# Patient Record
Sex: Male | Born: 1980 | Hispanic: No | Marital: Married | Smoking: Never smoker
Health system: Southern US, Community
[De-identification: ages and names within clinical notes are randomized; demographics above are authoritative.]

---

## 2012-08-12 ENCOUNTER — Ambulatory Visit (INDEPENDENT_AMBULATORY_CARE_PROVIDER_SITE_OTHER): Payer: 59 | Admitting: Internal Medicine

## 2012-08-12 VITALS — BP 116/79 | HR 94 | Temp 98.0°F | Resp 16 | Ht 68.0 in | Wt 220.8 lb

## 2012-08-12 DIAGNOSIS — J029 Acute pharyngitis, unspecified: Secondary | ICD-10-CM

## 2012-08-12 DIAGNOSIS — J019 Acute sinusitis, unspecified: Secondary | ICD-10-CM

## 2012-08-12 LAB — POCT RAPID STREP A (OFFICE): Rapid Strep A Screen: NEGATIVE

## 2012-08-12 MED ORDER — HYDROCODONE-HOMATROPINE 5-1.5 MG/5ML PO SYRP: 5.0000 mL | ORAL_SOLUTION | Freq: Four times a day (QID) | ORAL | Status: DC | PRN

## 2012-08-12 MED ORDER — AMOXICILLIN 500 MG PO CAPS: 1000.0000 mg | ORAL_CAPSULE | Freq: Two times a day (BID) | ORAL | Status: DC

## 2012-08-12 NOTE — Addendum Note (Signed)
Addended by: Tonye Pearson on: 08/12/2012 10:38 AM   Modules accepted: Orders

## 2012-08-12 NOTE — Progress Notes (Signed)
  Subjective:    Patient ID: Jeremy Garza, male    DOB: 1981/01/06, 31 y.o.   MRN: [id number]  HPIOn Wednesday night he developed chills and fever and rapidly progressed to include nasal congestion with cough and sore throat Fever has continued His cough is occasionally productive and hurts in his upper chest/no shortness of breath or wheezing It hurts to swallow He has purulent nasal discharge with bloody discharge from the left nostril He has a headache with facial pressure His infant had hand foot mouth disease 2 weeks ago The whole family has had a cold/cough illness for the last 2 weeks     Review of Systems     Objective:   Physical Exam Vital signs stable TMs clear Nares purulent discharge with tenderness to percussion on the left maxillary area Throat is red without exudate No nodes Chest clear to auscultation   Results for orders placed in visit on 08/12/12  POCT RAPID STREP A (OFFICE)      Component Value Range   Rapid Strep A Screen Negative  Negative       Assessment & Plan:  Problem #1 fever Problem #2 acute sinusitis secondary to recent viral URI Problem #3 cough secondary  Plan-amoxicillin 1 g twice a day 10 days Hycodan Advil Fluids and bed rest Expect work by Monday or Tuesday

## 2012-08-18 ENCOUNTER — Other Ambulatory Visit: Payer: Self-pay | Admitting: Internal Medicine

## 2012-08-18 NOTE — Telephone Encounter (Signed)
At tl desk 

## 2012-08-20 ENCOUNTER — Other Ambulatory Visit: Payer: Self-pay | Admitting: Internal Medicine

## 2012-08-20 NOTE — Telephone Encounter (Signed)
Pharmacy told pt that he needs prior authorization for hydocodon cough syrup.  [facility] [address]  [phone]

## 2012-08-27 ENCOUNTER — Telehealth: Payer: Self-pay | Admitting: *Deleted

## 2012-08-27 NOTE — Telephone Encounter (Signed)
Jeremy Garza 08/20/2012 10:25 AM Signed  Pharmacy told pt that he needs prior authorization for hydocodon cough syrup.  [facility] [address]  [phone]

## 2012-08-27 NOTE — Telephone Encounter (Signed)
Advised pt that rx is at pharmacy and is ready

## 2012-09-02 ENCOUNTER — Other Ambulatory Visit: Payer: Self-pay | Admitting: Internal Medicine

## 2012-09-02 ENCOUNTER — Other Ambulatory Visit: Payer: Self-pay | Admitting: Physician Assistant

## 2012-09-06 ENCOUNTER — Ambulatory Visit (INDEPENDENT_AMBULATORY_CARE_PROVIDER_SITE_OTHER): Payer: 59 | Admitting: Family Medicine

## 2012-09-06 ENCOUNTER — Emergency Department (HOSPITAL_BASED_OUTPATIENT_CLINIC_OR_DEPARTMENT_OTHER)
Admission: EM | Admit: 2012-09-06 | Discharge: 2012-09-07 | Disposition: A | Discharge status: Home / Self Care | Payer: 59 | Attending: Emergency Medicine | Admitting: Emergency Medicine

## 2012-09-06 ENCOUNTER — Emergency Department (HOSPITAL_BASED_OUTPATIENT_CLINIC_OR_DEPARTMENT_OTHER): Payer: 59

## 2012-09-06 ENCOUNTER — Encounter (HOSPITAL_BASED_OUTPATIENT_CLINIC_OR_DEPARTMENT_OTHER): Payer: Self-pay | Admitting: *Deleted

## 2012-09-06 VITALS — BP 118/81 | HR 98 | Temp 98.9°F | Resp 18 | Ht 68.0 in | Wt 220.0 lb

## 2012-09-06 DIAGNOSIS — J029 Acute pharyngitis, unspecified: Secondary | ICD-10-CM

## 2012-09-06 DIAGNOSIS — R0982 Postnasal drip: Secondary | ICD-10-CM

## 2012-09-06 DIAGNOSIS — Z79899 Other long term (current) drug therapy: Secondary | ICD-10-CM | POA: Insufficient documentation

## 2012-09-06 DIAGNOSIS — Z7982 Long term (current) use of aspirin: Secondary | ICD-10-CM | POA: Insufficient documentation

## 2012-09-06 LAB — POCT RAPID STREP A (OFFICE): Rapid Strep A Screen: NEGATIVE

## 2012-09-06 MED ORDER — FLUTICASONE PROPIONATE 50 MCG/ACT NA SUSP: 2.0000 | Freq: Every day | NASAL | Status: DC

## 2012-09-06 MED ORDER — CETIRIZINE HCL 10 MG PO TABS: 10.0000 mg | ORAL_TABLET | Freq: Every day | ORAL | Status: DC

## 2012-09-06 NOTE — ED Notes (Signed)
C/o sore throat x 1 month with no relief from antibiotics. Pt was seen at [facility] today for same.

## 2012-09-06 NOTE — Patient Instructions (Signed)
Very nice to meet you. I think this is all part of your allergies. I'm giving you to new medications. One is called Zyrtec. Take one pill daily or at night. He also going to do Flonase which is a nose spray. 2 one spray in each nostril daily. If not better in 2 weeks please come back and be seen. If you get fevers chills shortness of breath or trouble breathing please seek medical attention immediately.   Allergic Rhinitis Allergic rhinitis is when the mucous membranes in the nose respond to allergens. Allergens are particles in the air that cause your body to have an allergic reaction. This causes you to release allergic antibodies. Through a chain of events, these eventually cause you to release histamine into the blood stream (hence the use of antihistamines). Although meant to be protective to the body, it is this release that causes your discomfort, such as frequent sneezing, congestion and an itchy runny nose.  CAUSES  The pollen allergens may come from grasses, trees, and weeds. This is seasonal allergic rhinitis, or "hay fever." Other allergens cause year-round allergic rhinitis (perennial allergic rhinitis) such as house dust mite allergen, pet dander and mold spores.  SYMPTOMS   Nasal stuffiness (congestion).  Runny, itchy nose with sneezing and tearing of the eyes.  There is often an itching of the mouth, eyes and ears. It cannot be cured, but it can be controlled with medications. DIAGNOSIS  If you are unable to determine the offending allergen, skin or blood testing may find it. TREATMENT   Avoid the allergen.  Medications and allergy shots (immunotherapy) can help.  Hay fever may often be treated with antihistamines in pill or nasal spray forms. Antihistamines block the effects of histamine. There are over-the-counter medicines that may help with nasal congestion and swelling around the eyes. Check with your caregiver before taking or giving this medicine. If the treatment  above does not work, there are many new medications your caregiver can prescribe. Stronger medications may be used if initial measures are ineffective. Desensitizing injections can be used if medications and avoidance fails. Desensitization is when a patient is given ongoing shots until the body becomes less sensitive to the allergen. Make sure you follow up with your caregiver if problems continue. SEEK MEDICAL CARE IF:   You develop fever (more than 100.5 F (38.1 C).  You develop a cough that does not stop easily (persistent).  You have shortness of breath.  You start wheezing.  Symptoms interfere with normal daily activities. Document Released: 06/08/2001 Document Revised: 12/06/2011 Document Reviewed: 12/18/2008 [facility] Patient Information 2013 [place], [place].

## 2012-09-06 NOTE — Progress Notes (Signed)
Patient is a 31 year old male who is here one month ago with complaint of acute sinusitis.  Patient states the sinusitis seemed to improve but now he has a sore throat the posterior pharynx. Patient states that this has been occurring for the approximately last 3-5 days. Patient denies any fever subjective chills. Patient states that he has no cough and only complains mostly of sore throat. Patient has had no change in diet, no recent weight loss and is not a smoker. Patient states he has a history of tonsillitis as a child. Patient has been taking over-the-counter Sudafed with no improvement.  Strep negative  Past medical history, social, surgical and family history all reviewed.   Review of systems: 14 system review is done and unremarkable as related to the orthopedic problem.  Physical Exam Blood pressure 118/81, pulse 98, temperature 98.9 F (37.2 C), temperature source Oral, resp. rate 18, height 5\' 8"  (1.727 m), weight 220 lb (99.791 kg), SpO2 99.00%. General: No apparent distress alert and oriented x3 mood and affect normal HEENT: Pupils equal round reactive to light and accommodation extraocular movements intact patient does have some erythema of the posterior pharynx with a postnasal drip. Patient does have mild enlargement of the tonsils but no exudate appreciated. Patient is tender to palpation with some mild swollen anterior lymph nodes. Respiratory: Clear to auscultation bilaterally Cardiovascular: Regular in rhythm no murmur Skin: No rash  Assessment Allergic rhinitis Plan Zyrtec daily Flonase daily Patient was given handout about other home modalities. Patient warned of potential red flags and when to seek medical attention Patient will followup in 2 weeks if not better.

## 2012-09-06 NOTE — ED Notes (Signed)
Reports sore throat x 1 month, went to urgent care today zithromax given,

## 2012-09-06 NOTE — ED Notes (Signed)
Pt reports that he has prescription for hycodan at [facility] that he has not picked up

## 2012-09-07 LAB — MONONUCLEOSIS SCREEN: Mono Screen: NEGATIVE

## 2012-09-07 LAB — RAPID STREP SCREEN (MED CTR MEBANE ONLY): Streptococcus, Group A Screen (Direct): NEGATIVE

## 2012-09-07 MED ORDER — LIDOCAINE VISCOUS 2 % MT SOLN
OROMUCOSAL | Status: AC
  Administered 2012-09-07: 20 mL via OROMUCOSAL
  Filled 2012-09-07: qty 15

## 2012-09-07 MED ORDER — TRAMADOL HCL 50 MG PO TABS: 50.0000 mg | ORAL_TABLET | Freq: Four times a day (QID) | ORAL | Status: DC | PRN

## 2012-09-07 MED ORDER — LIDOCAINE VISCOUS 2 % MT SOLN
20.0000 mL | Freq: Once | OROMUCOSAL | Status: AC
  Administered 2012-09-07: 20 mL via OROMUCOSAL

## 2012-09-07 NOTE — ED Provider Notes (Signed)
History     CSN: [id number]  Arrival date & time 09/06/12  2309   First MD Initiated Contact with Patient 09/06/12 2340      Chief Complaint  Patient presents with  . Sore Throat    (Consider location/radiation/quality/duration/timing/severity/associated sxs/prior treatment) Patient is a 31 y.o. male presenting with pharyngitis. The history is provided by the patient.  Sore Throat This is a chronic problem. The current episode started more than 1 week ago (more than a month). The problem occurs constantly. The problem has not changed since onset.Pertinent negatives include no chest pain, no abdominal pain, no headaches and no shortness of breath. Nothing aggravates the symptoms. Nothing relieves the symptoms. Treatments tried: narcotic antibiotics and decogestants. The treatment provided no relief.    History reviewed. No pertinent past medical history.  History reviewed. No pertinent past surgical history.  Family History  Problem Relation Age of Onset  . Diabetes Father     History  Substance Use Topics  . Smoking status: Never Smoker   . Smokeless tobacco: Not on file  . Alcohol Use: No      Review of Systems  Constitutional: Negative for fever.  HENT: Positive for sore throat and postnasal drip. Negative for facial swelling, drooling, trouble swallowing, neck pain, neck stiffness and voice change.   Respiratory: Negative for shortness of breath.   Cardiovascular: Negative for chest pain.  Gastrointestinal: Negative for abdominal pain.  Neurological: Negative for headaches.  All other systems reviewed and are negative.    Allergies  Review of patient's allergies indicates no known allergies.  Home Medications   Current Outpatient Rx  Name  Route  Sig  Dispense  Refill  . ASPIRIN EFFERVESCENT 325 MG PO TBEF   Oral   Take 325 mg by mouth every 6 (six) hours as needed.         Marland Kitchen CETIRIZINE HCL 10 MG PO TABS   Oral   Take 1 tablet (10 mg total) by mouth  daily.   30 tablet   11   . FLUTICASONE PROPIONATE 50 MCG/ACT NA SUSP   Nasal   Place 2 sprays into the nose daily.   16 g   6   . ZICAM ALLERGY RELIEF NA   Nasal   Place into the nose.         Marland Kitchen HYDROCODONE-HOMATROPINE 5-1.5 MG/5ML PO SYRP      TAKE 5 MLS EVERY 6 HOURS AS NEEDED FOR COUGH (NEED APPT BEFORE MORE REFILLS)   120 mL   0   . IBUPROFEN 200 MG PO TABS   Oral   Take 200 mg by mouth every 6 (six) hours as needed.         Marland Kitchen TRAMADOL HCL 50 MG PO TABS   Oral   Take 1 tablet (50 mg total) by mouth every 6 (six) hours as needed for pain.   15 tablet   0     BP 130/70  Pulse 88  Temp 98.3 F (36.8 C) (Oral)  Resp 18  Ht 5\' 8"  (1.727 m)  Wt 218 lb (98.884 kg)  BMI 33.15 kg/m2  SpO2 100%  Physical Exam  Constitutional: He is oriented to person, place, and time. He appears well-developed and well-nourished. No distress.  HENT:  Head: Normocephalic and atraumatic. No trismus in the jaw.  Right Ear: No mastoid tenderness. Tympanic membrane is not injected.  Left Ear: No mastoid tenderness. Tympanic membrane is not injected.  Mouth/Throat: Oropharynx is clear and  moist. No oral lesions. No uvula swelling. No oropharyngeal exudate, posterior oropharyngeal edema, posterior oropharyngeal erythema or tonsillar abscesses.  Neck: Normal range of motion. Neck supple. No tracheal deviation present.  Cardiovascular: Normal rate and regular rhythm.   Pulmonary/Chest: Effort normal and breath sounds normal. No stridor. He has no wheezes. He has no rales.  Abdominal: Soft. Bowel sounds are normal. There is no tenderness. There is no rebound and no guarding.  Musculoskeletal: Normal range of motion.  Lymphadenopathy:    He has no cervical adenopathy.  Neurological: He is alert and oriented to person, place, and time.  Skin: Skin is warm and dry.  Psychiatric: He has a normal mood and affect.    ED Course  Procedures (including critical care time)   Labs Reviewed   RAPID STREP SCREEN  MONONUCLEOSIS SCREEN   Dg Neck Soft Tissue  09/07/2012  *RADIOLOGY REPORT*  Clinical Data: Sore throat for 30 days.  Swollen tonsils.  Pain.  NECK SOFT TISSUES - 1+ VIEW  Comparison: None.  Findings: Single lateral soft tissue view of the neck is obtained. No radiopaque foreign bodies. No soft tissue gas collections suggested.  Epiglottis and aryepiglottic folds do not appear thickened.  No prevertebral soft tissue swelling.  Cervical airway appears patent in the lateral plane.  Degenerative changes in the cervical spine.  IMPRESSION: No soft tissue swelling or foreign body identified.   Original Report Authenticated By: Burman Nieves, M.D.      1. Post-nasal drip   2. Pharyngitis       MDM  Concur with plan for zyrtect for PND.  Patient on flonase.  Will provide pain medication and follow up with ENT.  Suspect some degree of reflux.  Patient verbalizes understanding and agrees to follow up       Jayliah Benett Smitty Cords, MD 09/07/12 0960

## 2013-07-15 ENCOUNTER — Ambulatory Visit: Payer: 59

## 2013-07-15 ENCOUNTER — Ambulatory Visit (INDEPENDENT_AMBULATORY_CARE_PROVIDER_SITE_OTHER): Payer: 59 | Admitting: Internal Medicine

## 2013-07-15 VITALS — BP 118/76 | HR 82 | Temp 98.4°F | Resp 16 | Ht 68.0 in | Wt 226.2 lb

## 2013-07-15 DIAGNOSIS — M549 Dorsalgia, unspecified: Secondary | ICD-10-CM

## 2013-07-15 DIAGNOSIS — M5126 Other intervertebral disc displacement, lumbar region: Secondary | ICD-10-CM

## 2013-07-15 MED ORDER — OXYCODONE-ACETAMINOPHEN 5-325 MG PO TABS: 1.0000 | ORAL_TABLET | Freq: Three times a day (TID) | ORAL | Status: DC | PRN

## 2013-07-15 NOTE — Progress Notes (Signed)
  Subjective:    Patient ID: Jeremy Garza, male    DOB: 03/09/1981, 32 y.o.   MRN: [id number]  HPI Pt here c/o of chronic back pain, he has been treated with percocet 10 mg. Originally from [place], he does not have a pcp he is in the waiting list to establish care in [place] does not remember doctors name or clinic. Friday he was moving furniture when it started hurting again he has tried tylenol and ibuprofen with no relief. Denies weakness or tingling of legs. Normal bowel movents.  No incontinence. Walks with pain   Review of Systems     Objective:   Physical Exam  Vitals reviewed. Constitutional: He is oriented to person, place, and time. He appears well-developed and well-nourished. He appears distressed.  HENT:  Head: Normocephalic.  Eyes: EOM are normal.  Cardiovascular: Normal rate.   Pulmonary/Chest: Effort normal.  Musculoskeletal:       Lumbar back: He exhibits decreased range of motion, tenderness, bony tenderness, pain and spasm. He exhibits no swelling, no edema, no deformity, no laceration and normal pulse.  Neurological: He is alert and oriented to person, place, and time. No cranial nerve deficit or sensory deficit. He exhibits abnormal muscle tone. Gait abnormal.  Reflex Scores:      Patellar reflexes are 1+ on the right side and 2+ on the left side.      Achilles reflexes are 1+ on the right side and 2+ on the left side. Strength 5/6 on right, 6/6 left  Skin: No rash noted.  Psychiatric: He has a normal mood and affect. His behavior is normal. Thought content normal.     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

## 2013-07-15 NOTE — Progress Notes (Signed)
  Subjective:    Patient ID: Jeremy Garza, male    DOB: July 26, 1981, 32 y.o.   MRN: [id number]  HPI    Review of Systems     Objective:   Physical Exam        Assessment & Plan:

## 2013-08-09 ENCOUNTER — Ambulatory Visit (INDEPENDENT_AMBULATORY_CARE_PROVIDER_SITE_OTHER): Payer: 59 | Admitting: Emergency Medicine

## 2013-08-09 VITALS — BP 102/64 | HR 83 | Temp 98.3°F | Resp 16 | Ht 68.75 in | Wt 228.6 lb

## 2013-08-09 DIAGNOSIS — S335XXA Sprain of ligaments of lumbar spine, initial encounter: Secondary | ICD-10-CM

## 2013-08-09 DIAGNOSIS — M5126 Other intervertebral disc displacement, lumbar region: Secondary | ICD-10-CM

## 2013-08-09 DIAGNOSIS — M549 Dorsalgia, unspecified: Secondary | ICD-10-CM

## 2013-08-09 MED ORDER — NAPROXEN SODIUM 550 MG PO TABS: 550.0000 mg | ORAL_TABLET | Freq: Two times a day (BID) | ORAL | Status: DC

## 2013-08-09 MED ORDER — OXYCODONE-ACETAMINOPHEN 5-325 MG PO TABS: 1.0000 | ORAL_TABLET | Freq: Three times a day (TID) | ORAL | Status: DC | PRN

## 2013-08-09 MED ORDER — CYCLOBENZAPRINE HCL 10 MG PO TABS: 10.0000 mg | ORAL_TABLET | Freq: Three times a day (TID) | ORAL | Status: DC | PRN

## 2013-08-09 NOTE — Patient Instructions (Signed)

## 2013-08-09 NOTE — Progress Notes (Signed)
[facility] [address] [place] [address] [phone]- 0000  Date:  08/09/2013   Name:  Jeremy Garza   DOB:  [birth year]   MRN:  [id number]  PCP:  No PCP Per Patient    Chief Complaint: Follow-up   History of Present Illness:  Jeremy Garza is a 32 y.o. very pleasant male patient who presents with the following:  Seen in office on 10/19 and treated for a history of HNP with an exacerbation of low back pain.  Now to office requesting a refill on his percocet.  He has no radicular symptoms and no radiation of pain.  No history of direct injury.  No history of MRI.  Moved to [facility] from [place] in 2012.  Is pending an appt with primary care doc for follow on care.  Says pain is in the left low back but on exam confused and says it is on the right low back.  Constant and burning.  No improvement with any thing but percocet.  No improvement with over the counter medications or other home remedies. Denies other complaint or health concern today.   There are no active problems to display for this patient.   History reviewed. No pertinent past medical history.  History reviewed. No pertinent past surgical history.  History  Substance Use Topics  . Smoking status: Never Smoker   . Smokeless tobacco: Not on file  . Alcohol Use: No    Family History  Problem Relation Age of Onset  . Diabetes Father     No Known Allergies  Medication list has been reviewed and updated.  Current Outpatient Prescriptions on File Prior to Visit  Medication Sig Dispense Refill  . oxyCODONE-acetaminophen (ROXICET) 5-325 MG per tablet Take 1 tablet by mouth every 8 (eight) hours as needed for pain.  20 tablet  0  . aspirin-sod bicarb-citric acid (ALKA-SELTZER) 325 MG TBEF Take 325 mg by mouth every 6 (six) hours as needed.      . cetirizine (ZYRTEC) 10 MG tablet Take 1 tablet (10 mg total) by mouth daily.  30 tablet  11  . fluticasone (FLONASE) 50 MCG/ACT nasal spray Place 2 sprays into  the nose daily.  16 g  6  . Homeopathic Products (ZICAM ALLERGY RELIEF NA) Place into the nose.      Marland Kitchen HYDROcodone-homatropine (HYCODAN) 5-1.5 MG/5ML syrup TAKE 5 MLS EVERY 6 HOURS AS NEEDED FOR COUGH (NEED APPT BEFORE MORE REFILLS)  120 mL  0  . ibuprofen (ADVIL,MOTRIN) 200 MG tablet Take 200 mg by mouth every 6 (six) hours as needed.      . traMADol (ULTRAM) 50 MG tablet Take 1 tablet (50 mg total) by mouth every 6 (six) hours as needed for pain.  15 tablet  0   No current facility-administered medications on file prior to visit.    Review of Systems:  As per HPI, otherwise negative.    Physical Examination: Filed Vitals:   08/09/13 1845  BP: 102/64  Pulse: 83  Temp: 98.3 F (36.8 C)  Resp: 16   Filed Vitals:   08/09/13 1845  Height: 5' 8.75" (1.746 m)  Weight: 228 lb 9.6 oz (103.692 kg)   Body mass index is 34.01 kg/(m^2). Ideal Body Weight: Weight in (lb) to have BMI = 25: 167.7   GEN: WDWN, NAD, Non-toxic, Alert & Oriented x 3 HEENT: Atraumatic, Normocephalic.  Ears and Nose: No external deformity. EXTR: No clubbing/cyanosis/edema NEURO: Normal gait. Gross motor and DTR  intact PSYCH: Normally interactive. Conversant. Not depressed or anxious appearing.  Calm demeanor.  BACK:  Tender in right lumbar para spinous muscle.  Assessment and Plan: Acute lumbar strain Offered PT but refused Companion annoyed that we would not just give a prescription "for what works" rather than undergo an examination. Flexeril Anaprox vicodin  Signed,  Phillips Odor, MD

## 2013-08-22 ENCOUNTER — Other Ambulatory Visit: Payer: Self-pay | Admitting: Emergency Medicine

## 2013-11-06 ENCOUNTER — Ambulatory Visit (INDEPENDENT_AMBULATORY_CARE_PROVIDER_SITE_OTHER): Payer: 59 | Admitting: Family Medicine

## 2013-11-06 VITALS — BP 125/80 | HR 80 | Temp 98.4°F | Resp 18 | Wt 223.0 lb

## 2013-11-06 DIAGNOSIS — R05 Cough: Secondary | ICD-10-CM

## 2013-11-06 DIAGNOSIS — R059 Cough, unspecified: Secondary | ICD-10-CM

## 2013-11-06 DIAGNOSIS — R509 Fever, unspecified: Secondary | ICD-10-CM

## 2013-11-06 LAB — POCT CBC
GRANULOCYTE PERCENT: 62.1 % (ref 37–80)
HEMATOCRIT: 48.2 % (ref 43.5–53.7)
Hemoglobin: 15.6 g/dL (ref 14.1–18.1)
LYMPH, POC: 2 (ref 0.6–3.4)
MCH: 27.7 pg (ref 27–31.2)
MCHC: 32.4 g/dL (ref 31.8–35.4)
MCV: 85.6 fL (ref 80–97)
MID (cbc): 1 — AB (ref 0–0.9)
MPV: 8.5 fL (ref 0–99.8)
POC Granulocyte: 4.9 (ref 2–6.9)
POC LYMPH %: 25.8 % (ref 10–50)
POC MID %: 12.1 %M — AB (ref 0–12)
Platelet Count, POC: 322 10*3/uL (ref 142–424)
RBC: 5.63 M/uL (ref 4.69–6.13)
RDW, POC: 13.3 %
WBC: 7.9 10*3/uL (ref 4.6–10.2)

## 2013-11-06 LAB — POCT INFLUENZA A/B
INFLUENZA B, POC: NEGATIVE
Influenza A, POC: NEGATIVE

## 2013-11-06 MED ORDER — DOXYCYCLINE HYCLATE 100 MG PO TABS: 100.0000 mg | ORAL_TABLET | Freq: Two times a day (BID) | ORAL | Status: DC

## 2013-11-06 MED ORDER — HYDROCODONE-HOMATROPINE 5-1.5 MG/5ML PO SYRP: 5.0000 mL | ORAL_SOLUTION | Freq: Three times a day (TID) | ORAL | Status: AC | PRN

## 2013-11-06 NOTE — Patient Instructions (Signed)
You may have the flu although your test is negative today. I do suspect this is a viral infection- the doxycycline rx may not help you because it is for bacteria (antibiotic).  If you would like, leave this rx at the drug store for the next few days and see if you do not get better on your own . If your symptoms continue you can certainly fill and take this medication  Let me know if you are not feeling better in the next few days- Sooner if worse.

## 2013-11-06 NOTE — Progress Notes (Signed)
[facility] [address] [place] [address] [phone] 0000  Date:  11/06/2013   Name:  Jeremy Garza   DOB:  1980-12-04   MRN:  [id number]  PCP:  No PCP Per Patient    Chief Complaint: Cough, Fever and Generalized Body Aches   History of Present Illness:  Jeremy Garza is a 33 y.o. very pleasant male patient who presents with the following:  He is here today with illness- started a couple of days ago.  He notes a painful cough productive of mucus.   He also notes a HA, ear discomfort.  He thinks he might have had a temperature- subjective only. Also ST.  He did take advil this am- 800 mg He has aches and feels very tired and "very, very bad."  No Gi symptoms He is generally healthy.  His daughter has been ill recetnly with a cough- otherwise she is not as ill.    [facility]ussin- however this did not seem to help.  "I was up all night coughing."   There are no active problems to display for this patient.   No past medical history on file.  No past surgical history on file.  History  Substance Use Topics  . Smoking status: Never Smoker   . Smokeless tobacco: Not on file  . Alcohol Use: No    Family History  Problem Relation Age of Onset  . Diabetes Father     No Known Allergies  Medication list has been reviewed and updated.  Current Outpatient Prescriptions on File Prior to Visit  Medication Sig Dispense Refill  . HYDROcodone-homatropine (HYCODAN) 5-1.5 MG/5ML syrup TAKE 5 MLS EVERY 6 HOURS AS NEEDED FOR COUGH (NEED APPT BEFORE MORE REFILLS)  120 mL  0  . oxyCODONE-acetaminophen (ROXICET) 5-325 MG per tablet Take 1 tablet by mouth every 8 (eight) hours as needed.  20 tablet  0  . traMADol (ULTRAM) 50 MG tablet Take 1 tablet (50 mg total) by mouth every 6 (six) hours as needed for pain.  15 tablet  0   No current facility-administered medications on file prior to visit.    Review of  Systems:  As per HPI- otherwise negative.   Physical Examination: Filed Vitals:   11/06/13 0841  BP: 125/80  Pulse: 80  Temp: 98.4 F (36.9 C)  Resp: 18   Filed Vitals:   11/06/13 0841  Weight: 223 lb (101.152 kg)   Body mass index is 33.18 kg/(m^2). Ideal Body Weight:    GEN: WDWN, NAD, Non-toxic, A & O x 3, overweight HEENT: Atraumatic, Normocephalic. Neck supple. No masses, No LAD.  Bilateral TM wnl, oropharynx normal.  PEERL,EOMI.  Nasal cavity is congested Ears and Nose: No external deformity. CV: RRR, No M/G/R. No JVD. No thrill. No extra heart sounds. PULM: CTA B, no wheezes, crackles, rhonchi. No retractions. No resp. distress. No accessory muscle use. EXTR: No c/c/e NEURO Normal gait.  PSYCH: Normally interactive. Conversant. Not depressed or anxious appearing.  Calm demeanor.   Results for orders placed in visit on 11/06/13  POCT CBC      Result Value Range   WBC 7.9  4.6 - 10.2 K/uL   Lymph, poc 2.0  0.6 - 3.4   POC LYMPH PERCENT 25.8  10 - 50 %L   MID (cbc) 1.0 (*) 0 - 0.9   POC MID % 12.1 (*) 0 - 12 %M   POC Granulocyte  4.9  2 - 6.9   Granulocyte percent 62.1  37 - 80 %G   RBC 5.63  4.69 - 6.13 M/uL   Hemoglobin 15.6  14.1 - 18.1 g/dL   HCT, POC 16.148.2  09.643.5 - 53.7 %   MCV 85.6  80 - 97 fL   MCH, POC 27.7  27 - 31.2 pg   MCHC 32.4  31.8 - 35.4 g/dL   RDW, POC 04.513.3     Platelet Count, POC 322  142 - 424 K/uL   MPV 8.5  0 - 99.8 fL  POCT INFLUENZA A/B      Result Value Range   Influenza A, POC Negative     Influenza B, POC Negative       Assessment and Plan: Cough - Plan: POCT CBC, doxycycline (VIBRA-TABS) 100 MG tablet, HYDROcodone-homatropine (HYCODAN) 5-1.5 MG/5ML syrup  Fever, unspecified - Plan: POCT Influenza A/B  Probably viral URI- flu is possible.   See patient instructions for more details.   Try the hycodan instead of cheritussin- do not combine together  Signed Abbe AmsterdamJessica Copland, MD

## 2013-11-22 ENCOUNTER — Other Ambulatory Visit: Payer: Self-pay | Admitting: Emergency Medicine

## 2013-11-23 NOTE — Telephone Encounter (Signed)
Dr Dareen PianoAnderson, do you want to give pt RFs of these?

## 2013-11-29 ENCOUNTER — Other Ambulatory Visit: Payer: Self-pay | Admitting: Family Medicine

## 2013-11-29 ENCOUNTER — Ambulatory Visit (HOSPITAL_COMMUNITY)
Admission: RE | Admit: 2013-11-29 | Discharge: 2013-11-29 | Disposition: A | Discharge status: Home / Self Care | Payer: 59 | Source: Ambulatory Visit | Attending: Family Medicine | Admitting: Family Medicine

## 2013-11-29 ENCOUNTER — Ambulatory Visit (INDEPENDENT_AMBULATORY_CARE_PROVIDER_SITE_OTHER): Payer: 59 | Admitting: Family Medicine

## 2013-11-29 VITALS — BP 140/92 | HR 87 | Temp 98.0°F | Resp 18 | Wt 228.0 lb

## 2013-11-29 DIAGNOSIS — R51 Headache: Secondary | ICD-10-CM | POA: Insufficient documentation

## 2013-11-29 DIAGNOSIS — R519 Headache, unspecified: Secondary | ICD-10-CM

## 2013-11-29 DIAGNOSIS — J0100 Acute maxillary sinusitis, unspecified: Secondary | ICD-10-CM

## 2013-11-29 DIAGNOSIS — M5126 Other intervertebral disc displacement, lumbar region: Secondary | ICD-10-CM

## 2013-11-29 DIAGNOSIS — M549 Dorsalgia, unspecified: Secondary | ICD-10-CM

## 2013-11-29 LAB — POCT CBC
Granulocyte percent: 69.3 %G (ref 37–80)
HCT, POC: 40.2 % — AB (ref 43.5–53.7)
Hemoglobin: 12.9 g/dL — AB (ref 14.1–18.1)
Lymph, poc: 2.5 (ref 0.6–3.4)
MCH, POC: 26.9 pg — AB (ref 27–31.2)
MCHC: 32.1 g/dL (ref 31.8–35.4)
MCV: 83.7 fL (ref 80–97)
MID (cbc): 0.8 (ref 0–0.9)
MPV: 8.4 fL (ref 0–99.8)
POC Granulocyte: 7.5 — AB (ref 2–6.9)
POC LYMPH PERCENT: 23.6 %L (ref 10–50)
POC MID %: 7.1 %M (ref 0–12)
Platelet Count, POC: 379 10*3/uL (ref 142–424)
RBC: 4.8 M/uL (ref 4.69–6.13)
RDW, POC: 13.9 %
WBC: 10.8 10*3/uL — AB (ref 4.6–10.2)

## 2013-11-29 LAB — POCT SEDIMENTATION RATE: POCT SED RATE: 24 mm/hr — AB (ref 0–22)

## 2013-11-29 MED ORDER — OXYCODONE-ACETAMINOPHEN 5-325 MG PO TABS
1.0000 | ORAL_TABLET | Freq: Three times a day (TID) | ORAL | Status: AC | PRN
Start: 2013-11-29 — End: ?

## 2013-11-29 MED ORDER — AMOXICILLIN-POT CLAVULANATE 875-125 MG PO TABS: 1.0000 | ORAL_TABLET | Freq: Two times a day (BID) | ORAL | Status: AC

## 2013-11-29 MED ORDER — IOHEXOL 300 MG/ML  SOLN
100.0000 mL | Freq: Once | INTRAMUSCULAR | Status: AC | PRN
  Administered 2013-11-29: 100 mL via INTRAVENOUS

## 2013-11-29 NOTE — Progress Notes (Addendum)
This chart was scribed for Jeremy SidleKurt Lauenstein, MD by Nicholos Johnsenise Iheanachor, Medical Scribe. This patient's care was started at 6:40 PM  Patient ID: Jeremy Garza MRN: [id number], DOB: 02/14/1981, 33 y.o. Date of Encounter: 11/29/2013, 6:40 PM Primary Physician: No PCP Per Patient  Chief Complaint: jaw pain  HPI: 33 y.o. year old male with history below presents with stabbing right sided facial pain w/ associated swelling, onset 1 day ago. States he just woke up and the pain was present. Reports he cannot open or close his jaw without pain. Hurts to touch his face primarily along zyomatic arch and termporal region. States he took advil yesterday with no relief. Also took 2 neproxen this morning with no relief. Says he was only able to eat mashed potatoes, soup, and a milkshake today due to pain. Also states he was unable to sleep last night. Had to sleep with mouth open and reports drooling because of that. . Denies visual disturbances  No past medical history on file.   Home Meds: Prior to Admission medications   Medication Sig Start Date End Date Taking? Authorizing Provider  cyclobenzaprine (FLEXERIL) 10 MG tablet TAKE 1 TABLET BY MOUTH 3 TIMES A DAY AS NEEDED FOR MUSCLE SPASM    Phillips OdorJeffery Anderson, MD  doxycycline (VIBRA-TABS) 100 MG tablet Take 1 tablet (100 mg total) by mouth 2 (two) times daily. 11/06/13   Gwenlyn FoundJessica C Copland, MD  HYDROcodone-homatropine (HYCODAN) 5-1.5 MG/5ML syrup Take 5 mLs by mouth every 8 (eight) hours as needed for cough. 11/06/13   Gwenlyn FoundJessica C Copland, MD  naproxen sodium (ANAPROX) 550 MG tablet TAKE 1 TABLET (550 MG TOTAL) BY MOUTH 2 (TWO) TIMES DAILY WITH A MEAL.    Phillips OdorJeffery Anderson, MD    Allergies: No Known Allergies  History   Social History   Marital Status: Married    Spouse Name: N/A    Number of Children: N/A   Years of Education: N/A   Occupational History   Not on file.   Social History Main Topics   Smoking status: Never Smoker    Smokeless tobacco:  Not on file   Alcohol Use: No   Drug Use: No   Sexual Activity: Yes   Other Topics Concern   Not on file   Social History Narrative   No narrative on file     Review of Systems: Constitutional: negative for chills, fever, night sweats, weight changes, or fatigue  HEENT: negative for vision changes, hearing loss, congestion, rhinorrhea, ST, epistaxis, or sinus pressure. Right sided face and jaw pain Cardiovascular: negative for chest pain or palpitations Respiratory: negative for hemoptysis, wheezing, shortness of breath, or cough Abdominal: negative for abdominal pain, nausea, vomiting, diarrhea, or constipation Dermatological: negative for rash Neurologic: negative for headache, dizziness, or syncope All other systems reviewed and are otherwise negative with the exception to those above and in the HPI.   Physical Exam: Blood pressure 140/92, pulse 87, temperature 98 F (36.7 C), temperature source Oral, resp. rate 18, weight 228 lb (103.42 kg), SpO2 98.00%., Body mass index is 33.92 kg/(m^2). General: Well developed, well nourished, in no acute distress. Head: Normocephalic, atraumatic, eyes without discharge, sclera non-icteric, nares are without discharge. Bilateral auditory canals clear, TM's are without perforation, pearly grey and translucent with reflective cone of light bilaterally. Oral cavity moist, posterior pharynx without exudate, erythema, peritonsillar abscess, or post nasal drip.  Neck: Supple. No thyromegaly. Full ROM. No lymphadenopathy. Lungs: Clear bilaterally to auscultation without wheezes, rales, or rhonchi. Breathing is  unlabored. Heart: RRR with S1 S2. No murmurs, rubs, or gallops appreciated. Abdomen: Soft, non-tender, non-distended with normoactive bowel sounds. No hepatomegaly. No rebound/guarding. No obvious abdominal masses. Msk:  Strength and tone normal for age. Extremities/Skin: Warm and dry. No clubbing or cyanosis. No edema. No rashes or  suspicious lesions. Neuro: Alert and oriented X 3. Moves all extremities spontaneously. Gait is normal. CNII-XII grossly in tact. Psych:  Responds to questions appropriately with a normal affect.   Labs: blood work Results for orders placed in visit on 11/29/13  POCT CBC      Result Value Ref Range   WBC 10.8 (*) 4.6 - 10.2 K/uL   Lymph, poc 2.5  0.6 - 3.4   POC LYMPH PERCENT 23.6  10 - 50 %L   MID (cbc) 0.8  0 - 0.9   POC MID % 7.1  0 - 12 %M   POC Granulocyte 7.5 (*) 2 - 6.9   Granulocyte percent 69.3  37 - 80 %G   RBC 4.80  4.69 - 6.13 M/uL   Hemoglobin 12.9 (*) 14.1 - 18.1 g/dL   HCT, POC 40.9 (*) 81.1 - 53.7 %   MCV 83.7  80 - 97 fL   MCH, POC 26.9 (*) 27 - 31.2 pg   MCHC 32.1  31.8 - 35.4 g/dL   RDW, POC 91.4     Platelet Count, POC 379  142 - 424 K/uL   MPV 8.4  0 - 99.8 fL      ASSESSMENT AND PLAN:  33 y.o. year old male with acute swelling and pain in the TMJ region which could represent either an acute TMJ syndrome or acute parotitis or acute trigeminal neuralgia.  CT ordered for stat reading tonight to R/O abscess.--> no abscess, acute sinusitis seen in right maxilla, will order augmentin 875 bid x 10 days along with percocet 5-325  Signed, Jeremy Sidle, MD 11/29/2013 6:42 PM

## 2013-11-29 NOTE — Addendum Note (Signed)
Addended by: Elvina SidleLAUENSTEIN, Kamoni Gentles on: 11/29/2013 08:44 PM   Modules accepted: Orders

## 2013-11-29 NOTE — Patient Instructions (Signed)
There are several possible explanations for this acute pain. One explanation is that you have TMJ syndrome. This be consistent with ear pain when trying to open and close her jaw. Another possibility is that you have an infection in the parotid gland which overlies the TMJ. Finally a third possibility is an irritated nerve called the trigeminal nerve.  For the test results we have at the present time I can't tell which one of these is. They're from one to give you pain medicine and what have you see a specialist tomorrow for further evaluation

## 2013-11-30 ENCOUNTER — Telehealth: Payer: Self-pay

## 2013-11-30 NOTE — Telephone Encounter (Signed)
States that his ENT doctor prescribed him Doxycyline. He states that he is already taking Doxycycline that Dr. Patsy Lageropland gave him. He wants to know if he discontinue what Dr.Copland gave him or start taking the Doxycycline from his ENT or stop the Doxycyline all together?   [phone]

## 2013-11-30 NOTE — Telephone Encounter (Signed)
Spoke to pt , he is aware he should only be taking the Augmentin. He has an appointment with ENT today at 1pm.

## 2013-11-30 NOTE — Telephone Encounter (Signed)
Patient says he stopped taking the doxycyline that dr. Patsy Lageropland prescribed and saw Dr. Elbert EwingsL for Augmentin.  He wants to take doxycyline and the augmentin at the same time.  For it to be effective.  Is it safe?  Please call.   [phone]

## 2013-12-03 NOTE — Telephone Encounter (Signed)
Lm for rtn call 

## 2013-12-04 ENCOUNTER — Encounter: Payer: Self-pay | Admitting: Family Medicine

## 2013-12-04 LAB — MUMPS ANTIBODY, IGM: Mumps IgM Value: <1:20 {titer}

## 2013-12-04 NOTE — Telephone Encounter (Signed)
Advised pt to continue taking medication as prescribed by the ENT we referred him to. Pt understands.

## 2013-12-07 LAB — MUMPS ANTIBODY, IGG: Mumps IgG: 88.4 AU/mL — ABNORMAL HIGH (ref <9.00)

## 2015-06-28 IMAGING — CT CT MAXILLOFACIAL W/ CM
1 series · 15 of 30 positions shown, 19 images · IV contrast (omnipaque)
Comparison: DG NECK SOFT TISSUE dated 09/07/2012

CLINICAL DATA: Right-sided facial pain for 2 days, assess for
abscess.

EXAM:
CT MAXILLOFACIAL WITH CONTRAST
TECHNIQUE: Multidetector CT imaging of the maxillofacial structures was
performed with intravenous contrast. Multiplanar CT image
reconstructions were also generated. A small metallic BB was placed
on the right temple in order to reliably differentiate right from
left.
CONTRAST:  100mL OMNIPAQUE IOHEXOL 300 MG/ML  SOLN

[Series 3: facial st · axial · 0.38mm/px · z∈[-304,-144]mm · 15 of 86 slices shown, 19 images]
[im 3/86  brain]
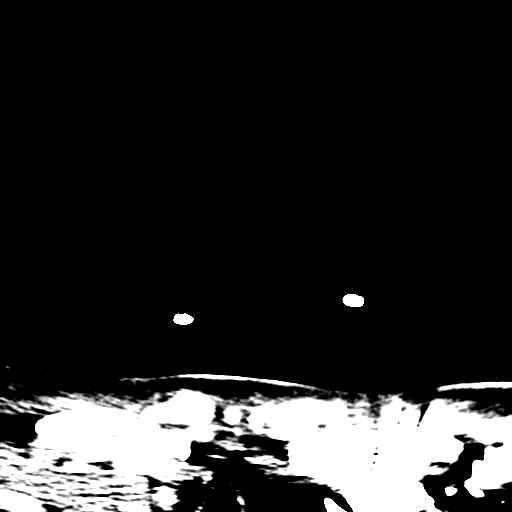
[im 3/86  bone]
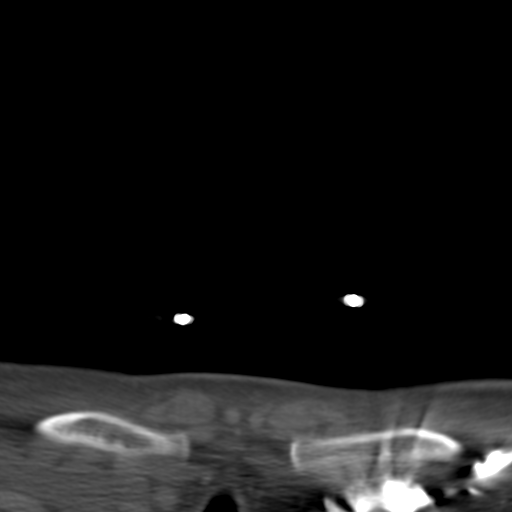
[im 9/86  bone]
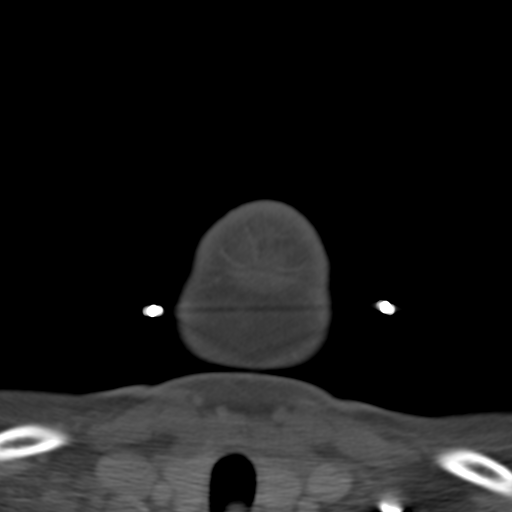
[im 15/86  bone]
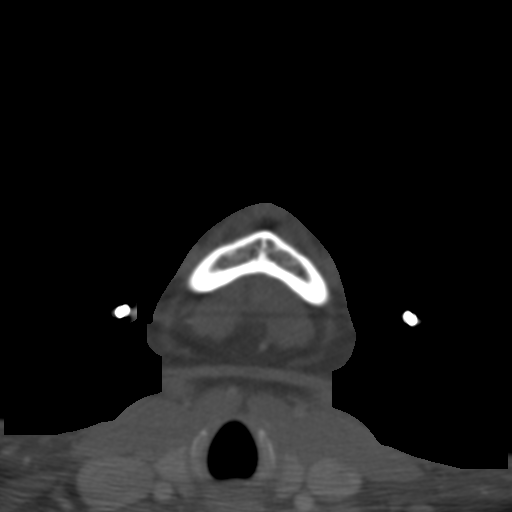
[im 21/86  bone]
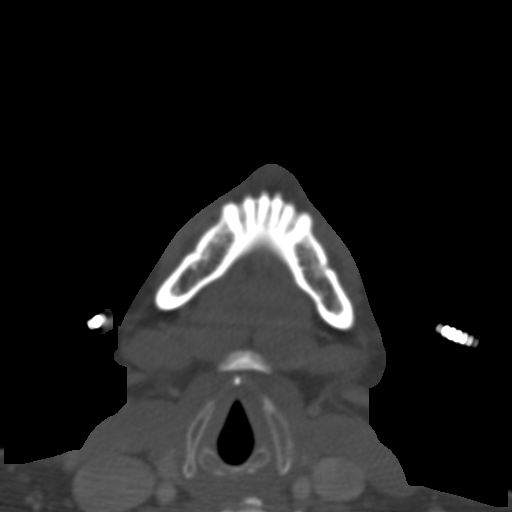
[im 27/86  brain]
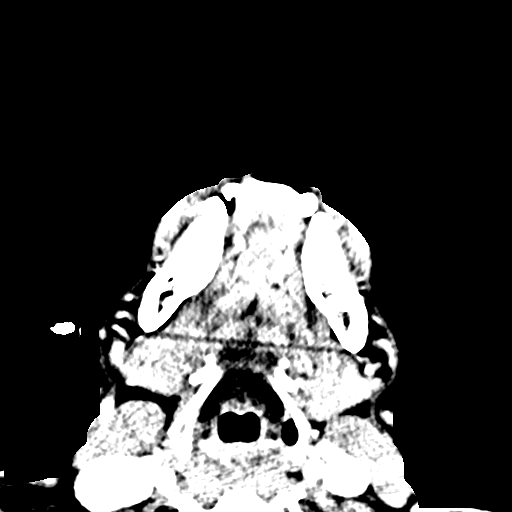
[im 27/86  bone]
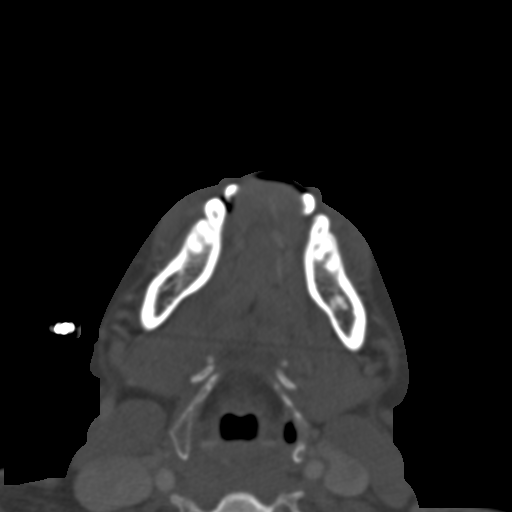
[im 33/86  bone]
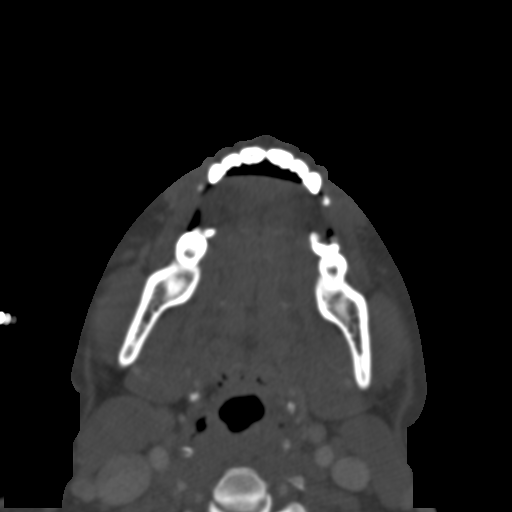
[im 39/86  bone]
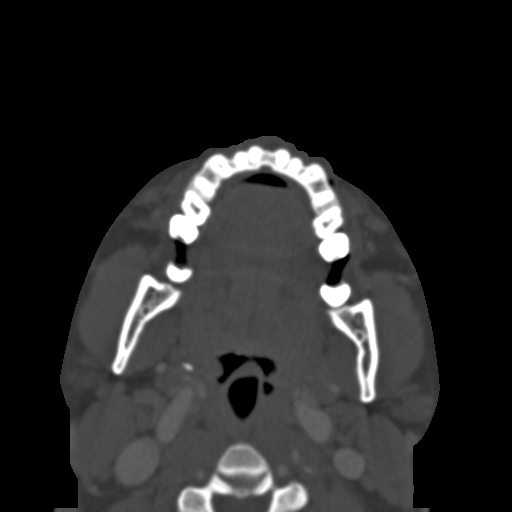
[im 44/86  bone]
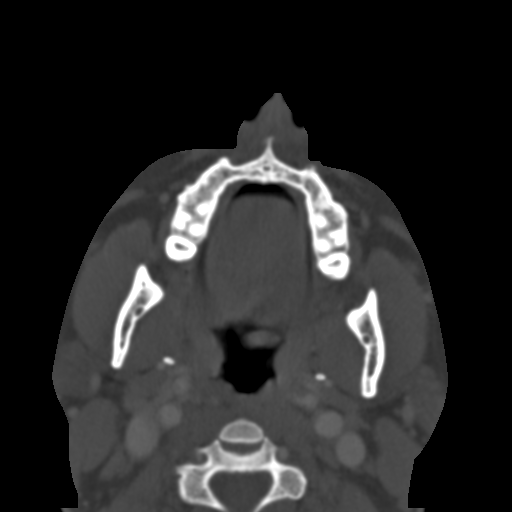
[im 47/86  brain]
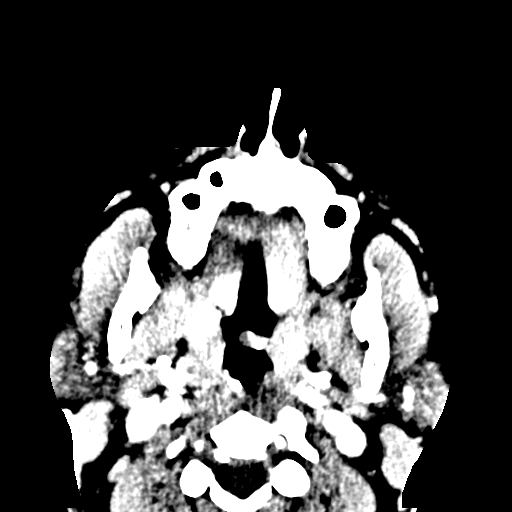
[im 47/86  bone]
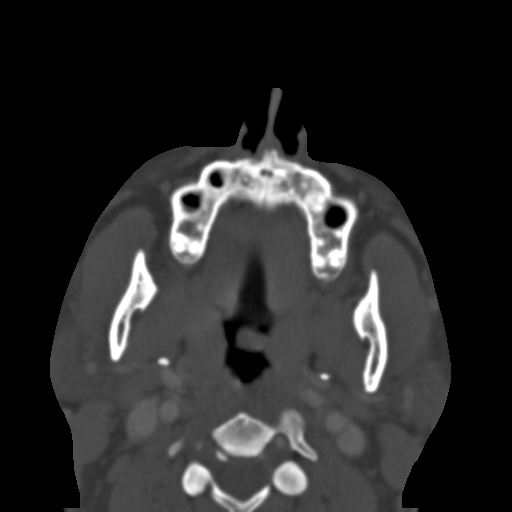
[im 53/86  bone]
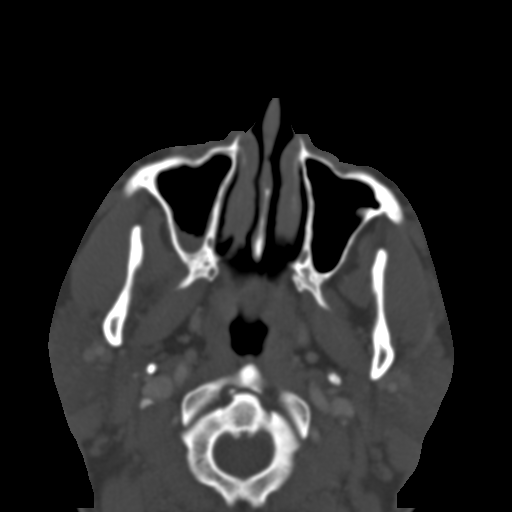
[im 59/86  bone]
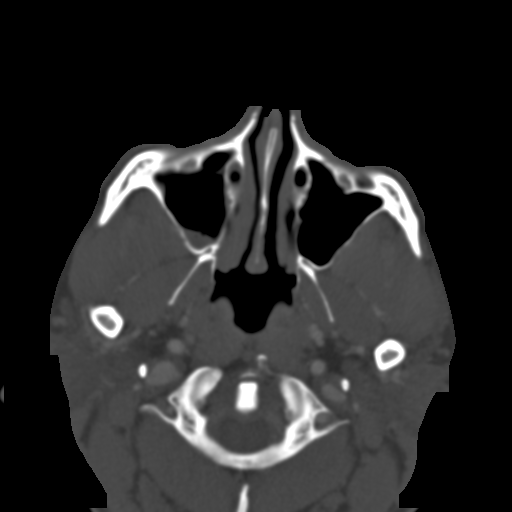
[im 65/86  bone]
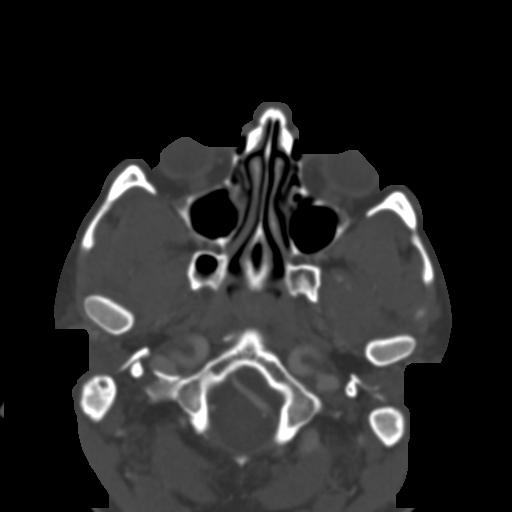
[im 71/86  brain]
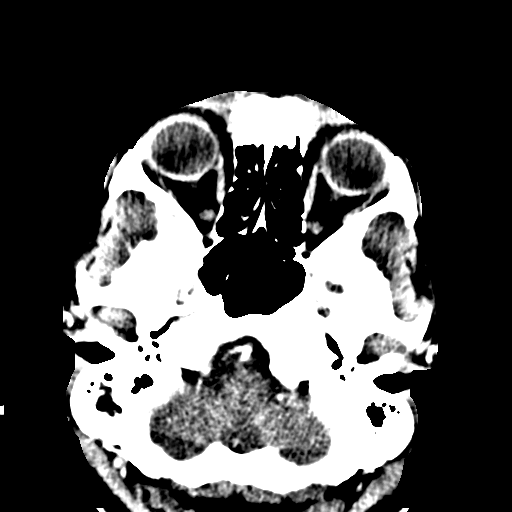
[im 71/86  bone]
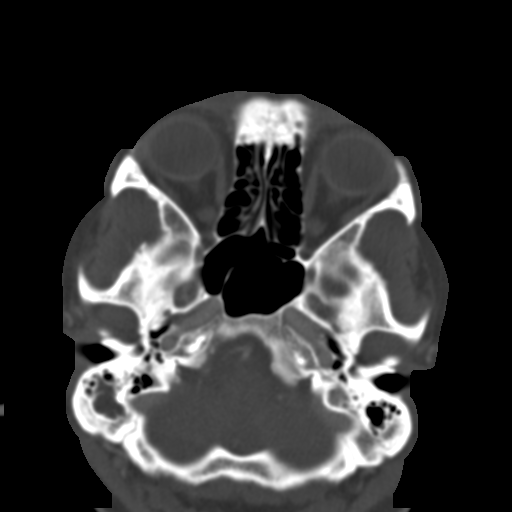
[im 77/86  bone]
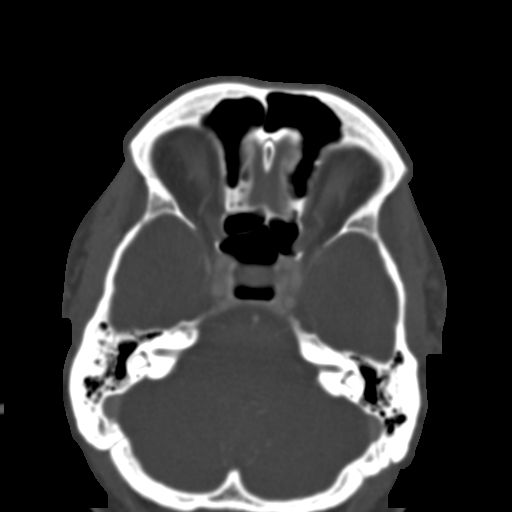
[im 83/86  bone]
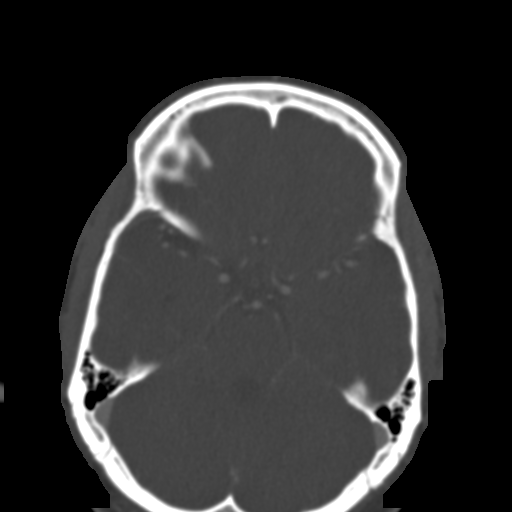

[15 of 30 positions shown; findings below may reference images not displayed]

FINDINGS: No free fluid or focal fluid collections with particular attention
to the right face. Right maxillary sinus air-fluid level in a
background of mild paranasal sinus mucosal thickening. Minimal soft
tissue density within right mastoid tip may reflect under
pneumatization.

No destructive bony lesions. No CT findings of dental caries nor
periapical abscess. No facial fracture. Nasal septum is midline.
Ocular globes and orbital contents are unremarkable. Included neck
is unremarkable. Mild to moderate C5-6 degenerative disc disease
with ventral spurring.
IMPRESSION: Mild paranasal sinus with right maxillary sinus air-fluid level
suggesting acute sinusitis.

No abscess.

  By: Komanovics Batbaatar
